# Patient Record
Sex: Male | Born: 1950
Health system: Southern US, Academic
[De-identification: ages and names within clinical notes are randomized; demographics above are authoritative.]

---

## 2006-04-20 ENCOUNTER — Ambulatory Visit (INDEPENDENT_AMBULATORY_CARE_PROVIDER_SITE_OTHER): Payer: Self-pay

## 2020-04-28 ENCOUNTER — Other Ambulatory Visit (INDEPENDENT_AMBULATORY_CARE_PROVIDER_SITE_OTHER): Payer: Self-pay | Admitting: Family

## 2020-04-28 NOTE — Progress Notes (Signed)
error 

## 2023-03-02 IMAGING — MR MRI LUMBAR SPINE WITHOUT CONTRAST
7 of 9 series · 15 of 48 positions shown · IV contrast (gadolinium)
Comparison: Lumbar spine x-rays January 22, 2023.

________________________________________________________________________________________________ 
MRI LUMBAR SPINE WITHOUT CONTRAST, 03/02/2023 [DATE]: 
CLINICAL INDICATION: Cervicalgia. Low back pain, unspecified. Radiculopathy, 
cervical region , low back pain. Right-sided.
TECHNIQUE: Multiplanar, multiecho position MR images of the lumbar spine were 
performed without intravenous gadolinium enhancement. Patient was scanned on a 
1.5T magnet

[Series 101: survey · axial · 10.0mm · 1.25mm/px · z∈[-33,+201]mm · 2 of 10 slices shown]
[im 1/10]
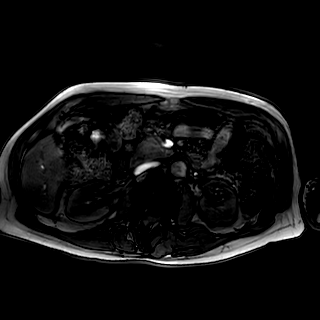
[im 10/10]
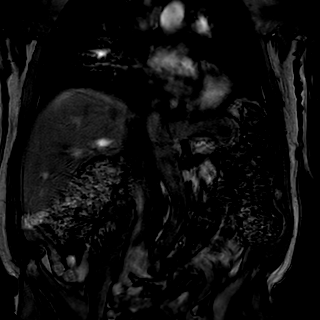

[Series 201: t2w_cor-surv · coronal · 6.0mm · 0.62mm/px · 1 of 10 slices shown]
[im 1/10]
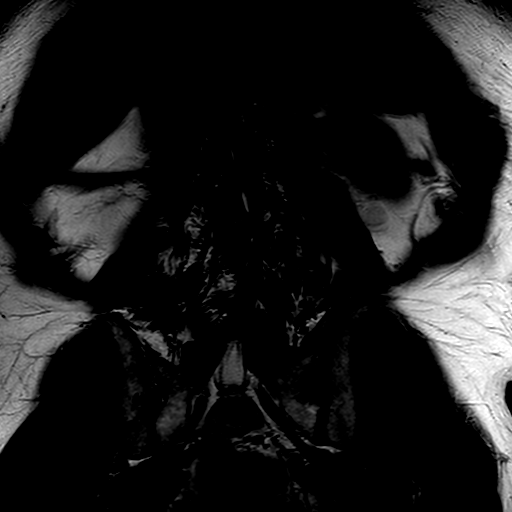

[Series 301: t1_tse_sag · sagittal · 4.0mm · 0.38mm/px · 2 of 22 slices shown]
[im 1/22]
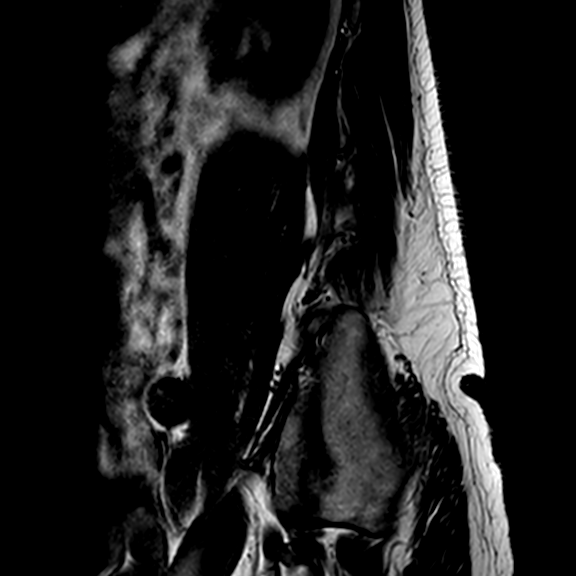
[im 22/22]
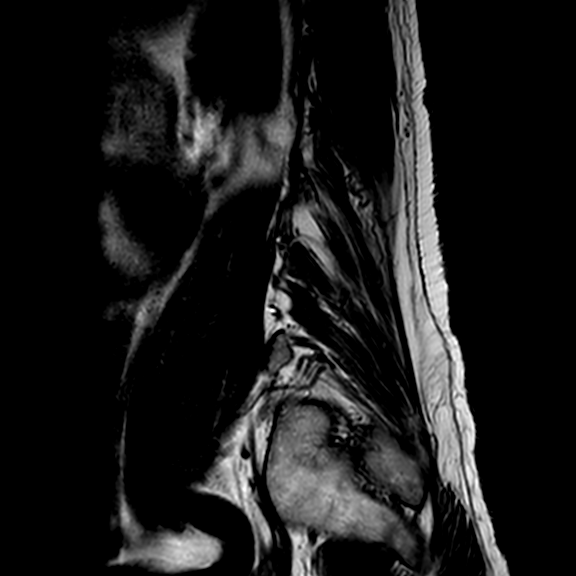

[Series 402: (id)_mdixon_tse · sagittal · 4.0mm · 0.51mm/px · 2 of 22 slices shown]
[im 1/22]
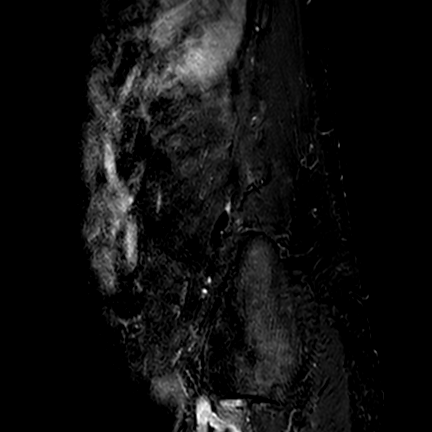
[im 22/22]
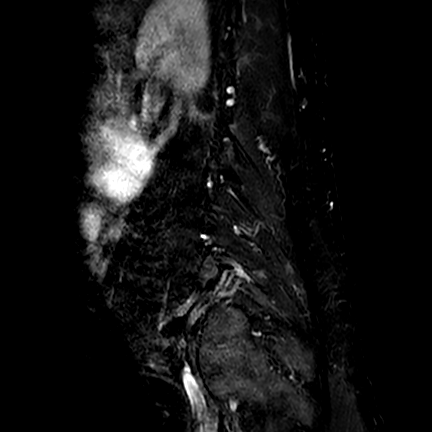

[Series 403: st2w_mdixon_tse · sagittal · 4.0mm · 0.51mm/px · 2 of 22 slices shown]
[im 1/22]
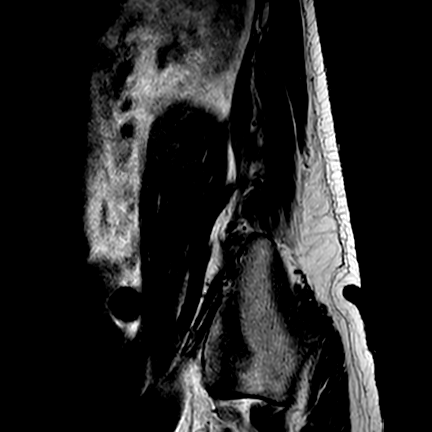
[im 22/22]
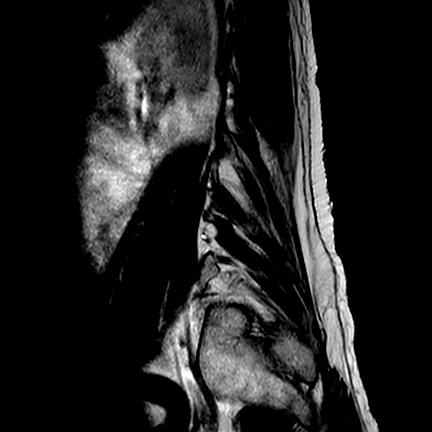

[Series 502: (id) view_ax mpr · axial · 1.0mm · 0.25mm/px · 1 of 145 slices shown]
[im 10/145]
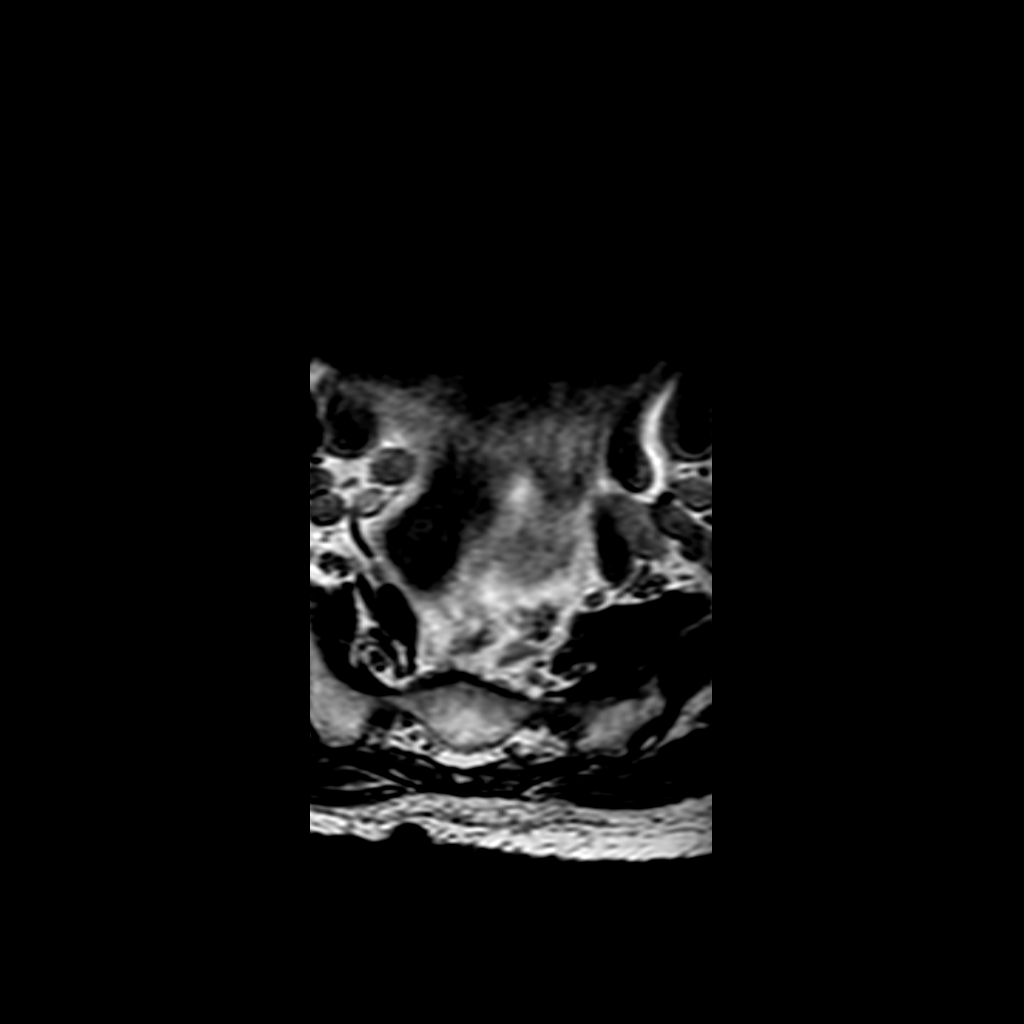

[Series 701: T1 · axial · 4.0mm · 0.39mm/px · z∈[-176,+6]mm · 5 of 42 slices shown]
[im 1/42]
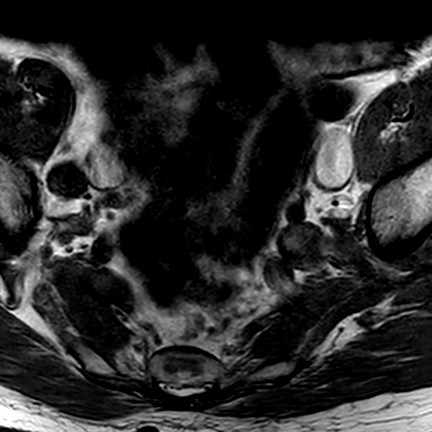
[im 11/42]
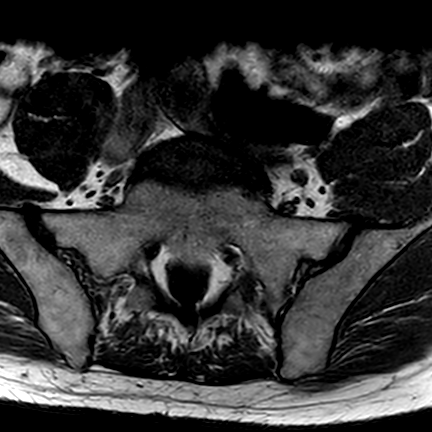
[im 21/42]
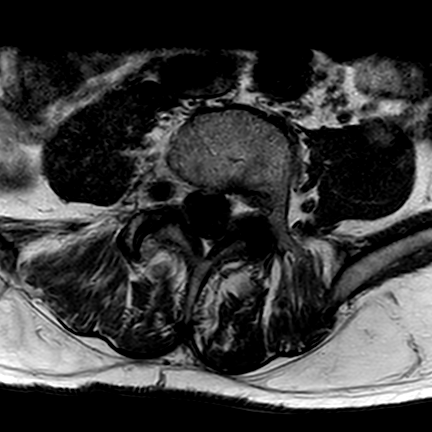
[im 31/42]
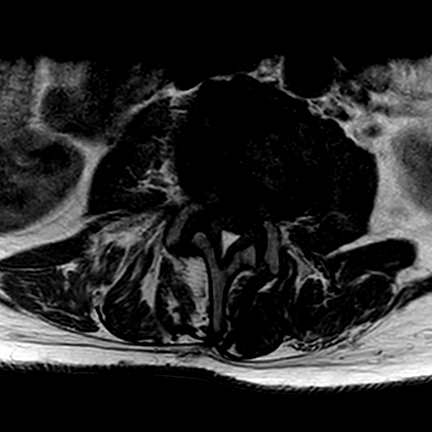
[im 42/42]
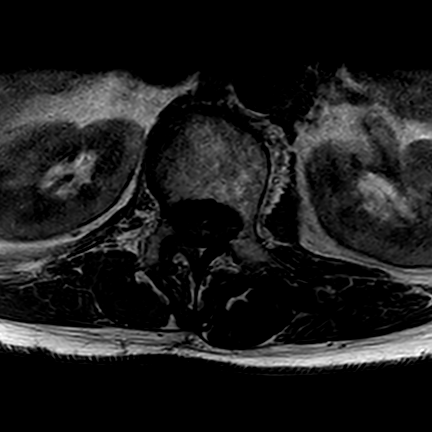

[15 of 48 positions shown; findings below may reference images not displayed]

FINDINGS: -------------------------------------------------------------------------------- 
------ 
GENERAL: 
Nomenclature is based on 5 lumbar type vertebral bodies.     
ALIGNMENT: Moderate levoconvex lumbar scoliosis. Stepwise grade 1 retrolisthesis 
T12 on L1, L1 on L2 and L2 on L3. Minimal grade 1 anterolisthesis L5 on S1. 
VERTEBRAL BODY HEIGHT: Normal.  
MARROW SIGNAL: No focal suspect signal abnormality. 
CORD SIGNAL: Normal distal spinal cord and cauda equina. Conus medullaris 
terminates at L1. 
ADDITIONAL FINDINGS: Right lower pole renal cyst. Ectasia left common iliac 
artery 21 mm. Right common iliac artery ectasia measures 24 mm. 
Modic I-II: L2-L3, L3-L4, L4-L5 
Ligamentum Flavum > 2.5 mm: All levels. 
-------------------------------------------------------------------------------- 
------ 
SEGMENTAL: 
T12-L1: Loss of disc signal. Minimal annular bulge. Minimal annular bulge. Small 
left foraminal disc protrusion. Canal and foramina are patent with normal 
facets. 
L1-L2: Moderate loss of disc height anteriorly. Loss of disc signal. 
Retrolisthesis with disc unroofing. Minimal annular bulge. Slight narrowing 
right lateral recess. Canal otherwise patent. Small bilateral facet joint 
effusions. Mild right foraminal narrowing. Left foramen patent. 
L2-L3: Severe loss of disc height specifically to the right. Endplate 
irregularity is degenerative. Loss of disc signal. Retrolisthesis with disc 
unroofing. Mild canal stenosis with slight narrowing right lateral recess. Left 
facet joint effusion. Mild right foraminal narrowing. Left foramen patent. 
L3-L4: Mild to moderate loss of disc height. Slight loss of disc signal with 
minimal annular bulge. Slight narrowing right lateral recess. Canal otherwise 
patent. Mild bilateral foraminal narrowing. The exiting left L3 nerve root is 
slightly deviated by left foraminal disc protrusion. 
L4-L5: Moderate loss of disc height. Schmorls node. Loss of disc signal. 
Posterior osteophytic ridging with mild diffuse annular bulge. Mild canal 
stenosis with slight lateral recess narrowing bilaterally. Facet arthropathy and 
small right facet joint effusion. Mild bilateral foraminal narrowing. 
L5-S1: Loss of disc signal. Mild annular bulge. Disc abuts the traversing S1 
nerve roots but without definite nerve root distortion. Canal patent. Small 
bilateral facet joint effusions. Mild left foraminal narrowing. Right foramen 
patent. 
-------------------------------------------------------------------------------- 
------
IMPRESSION: Degenerative and scoliotic changes. 
No significant or critical central canal narrowing. Variable lateral recess and 
foraminal narrowing detailed above. 
Ectasia of both common iliac arteries.

## 2023-03-02 IMAGING — MR MRI CERVICAL SPINE WITHOUT CONTRAST
5 of 7 series · 22 of 48 positions shown · IV contrast (gadolinium)
Comparison: None.

________________________________________________________________________________________________ 
MRI CERVICAL SPINE WITHOUT CONTRAST, 03/02/2023 [DATE]: 
CLINICAL INDICATION: Neck pain with radiation to right shoulder.
TECHNIQUE: Multiplanar, multiecho position MR images of the cervical spine were 
performed without intravenous gadolinium enhancement. Patient was scanned on a 
1.5T magnet.

[Series 101: survey · axial · 10.0mm · 1.25mm/px · z∈[-34,+149]mm · 4 of 10 slices shown]
[im 1/10]
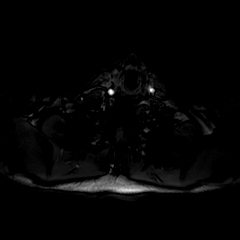
[im 4/10]
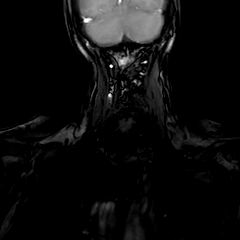
[im 7/10]
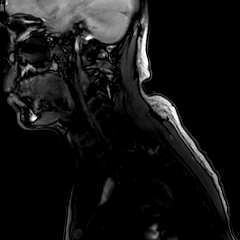
[im 10/10]
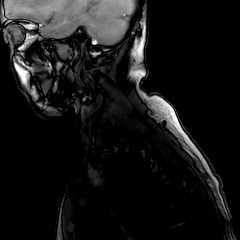

[Series 201: t2w_cor-surv · coronal · 5.0mm · 0.69mm/px · 2 of 7 slices shown]
[im 1/7]
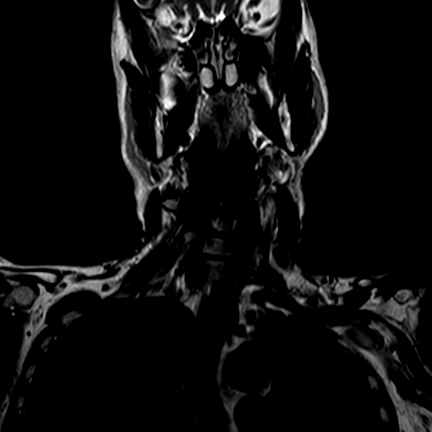
[im 7/7]
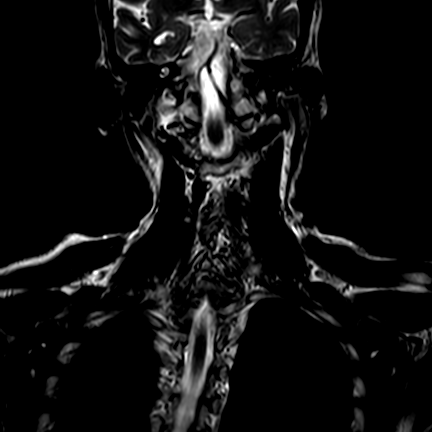

[Series 301: t1_sag · sagittal · 3.0mm · 0.35mm/px · 6 of 17 slices shown]
[im 1/17]
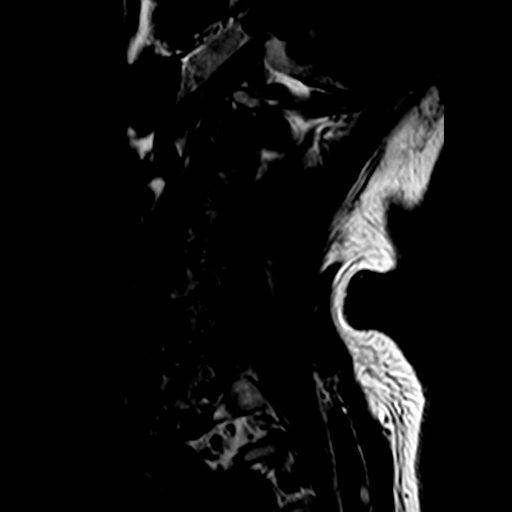
[im 4/17]
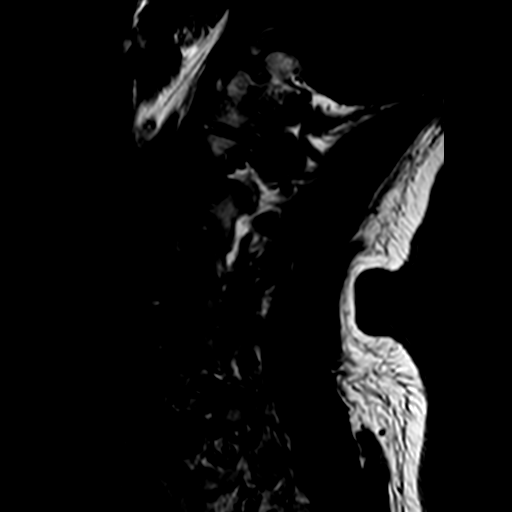
[im 7/17]
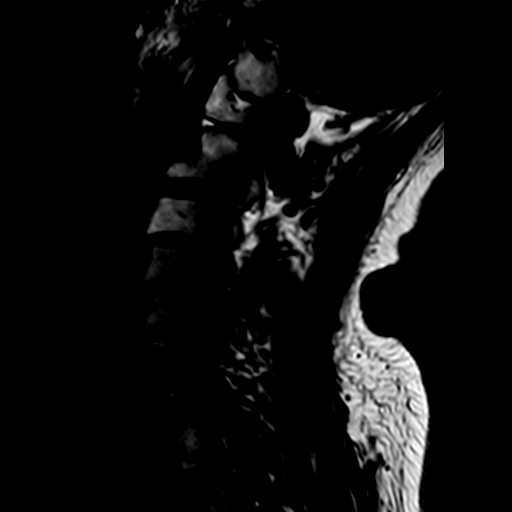
[im 10/17]
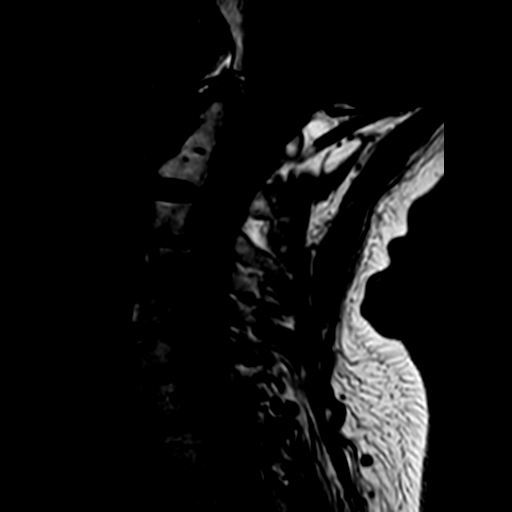
[im 13/17]
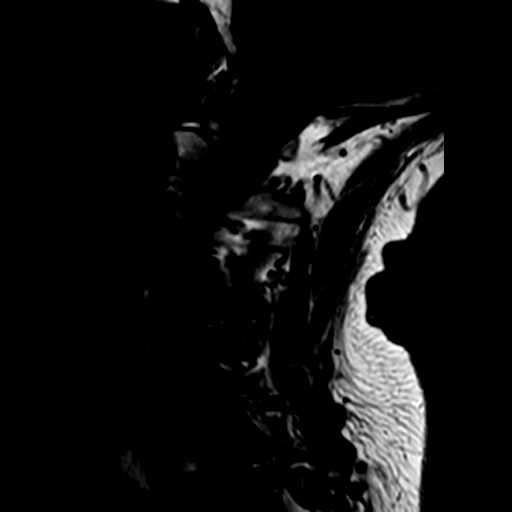
[im 17/17]
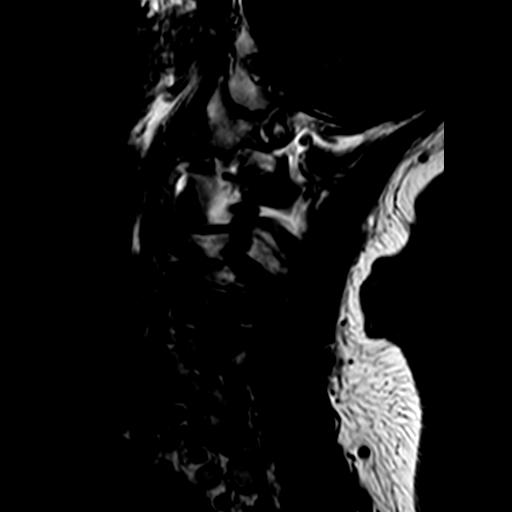

[Series 402: (id)_mdixon_tse · sagittal · 3.0mm · 0.42mm/px · 1 of 17 slices shown]
[im 1/17]
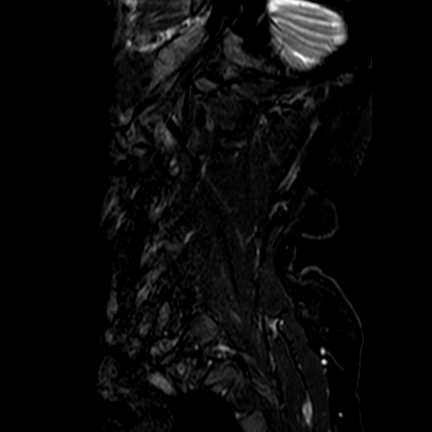

[Series 601: T2 · axial · 3.0mm · 0.31mm/px · z∈[-56,+38]mm · 9 of 34 slices shown]
[im 1/34]
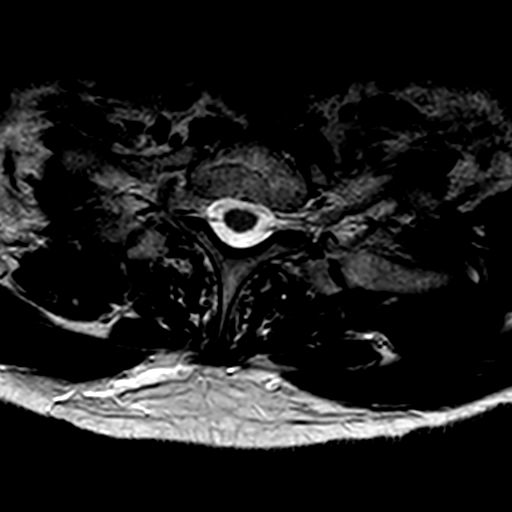
[im 7/34]
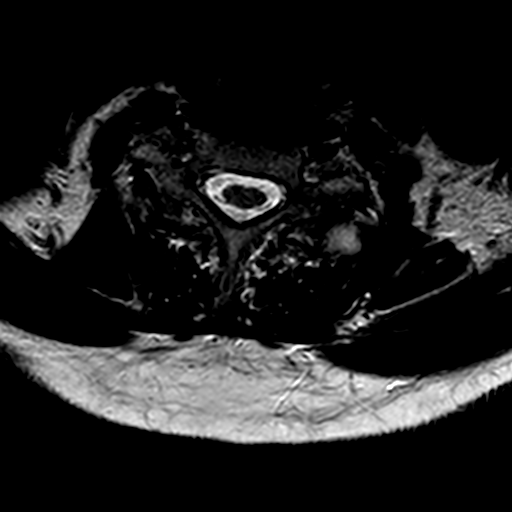
[im 10/34]
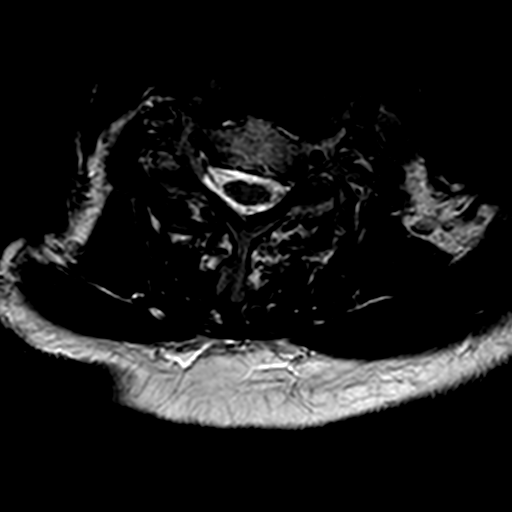
[im 16/34]
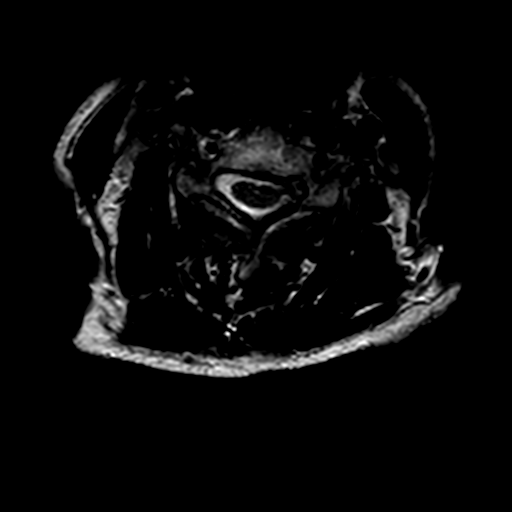
[im 19/34]
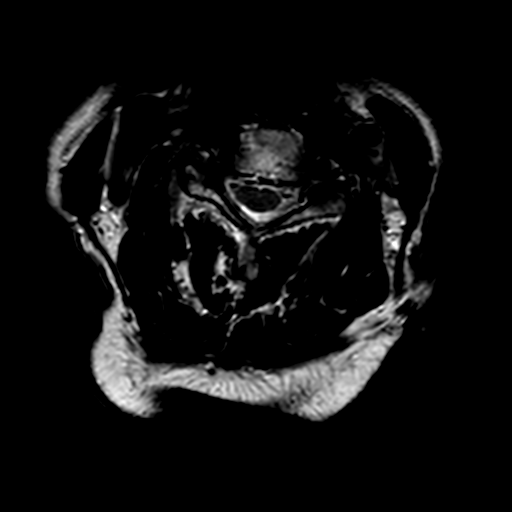
[im 25/34]
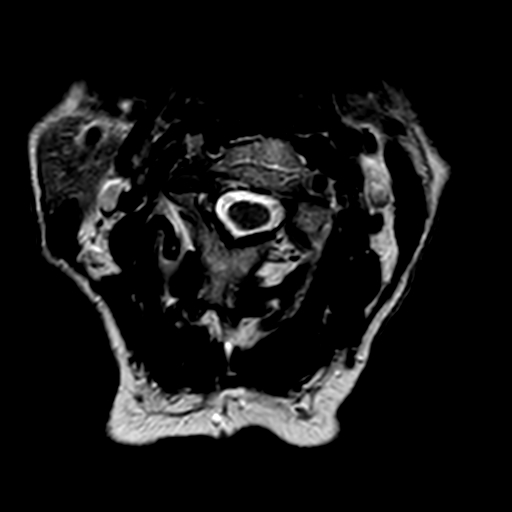
[im 28/34]
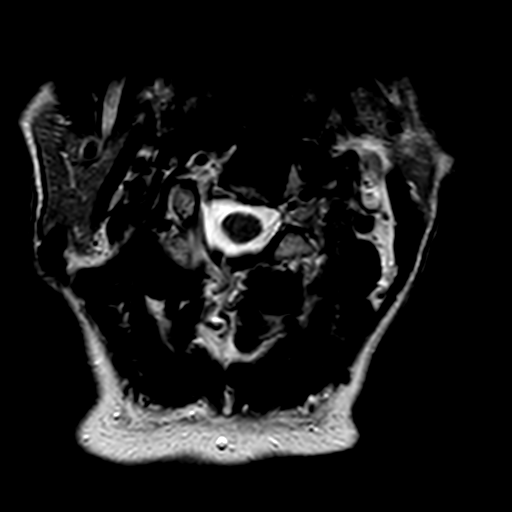
[im 31/34]
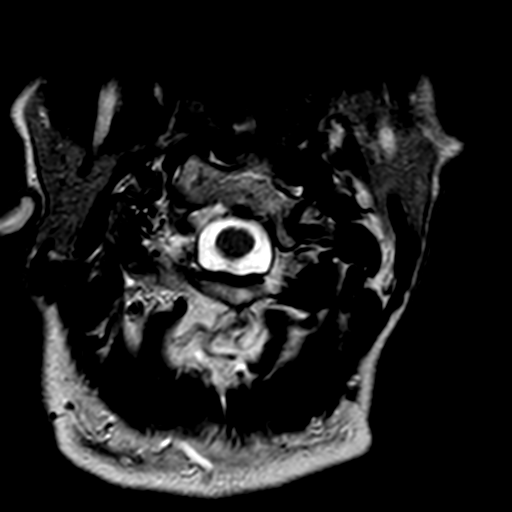
[im 34/34]
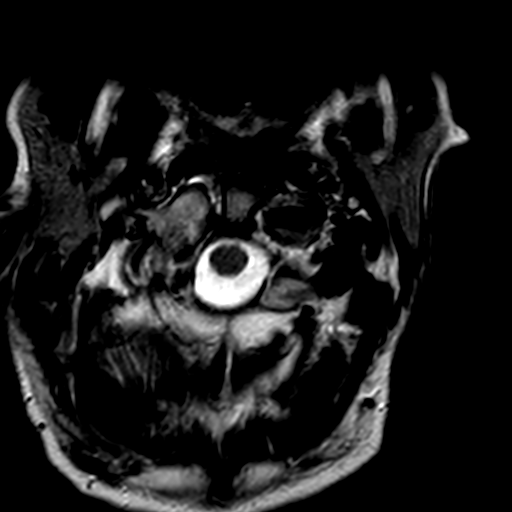

[22 of 48 positions shown; findings below may reference images not displayed]

FINDINGS: -------------------------------------------------------------------------------- 
----------------- 
GENERAL: 
ALIGNMENT: Leftward curvature of the cervical spine. Grade 1 anterolisthesis of 
C3 on C4. Minimal anterolisthesis of C4 on C5. Minimal retrolisthesis of C5 on 
C6. Grade 1 anterolisthesis of C7 on T1. 
VERTEBRAL BODY HEIGHT: Normal.  
MARROW SIGNAL: Active endplate change at C4-C5. T2 prolongation along the right 
C4-C5 facet joint which may result in local pain. 
CORD SIGNAL: Normal.  
ADDITIONAL FINDINGS: None. 
-------------------------------------------------------------------------------- 
---------------- 
SEGMENTAL: 
CRANIOCERVICAL JUNCTION: No significant stenosis. 
C2-C3: No significant central canal or neural foraminal narrowing. 
C3-C4: Uncovering of the disc space with disc osteophyte complex eccentric to 
the right side. Right uncovertebral joint hypertrophy. Right facet hypertrophy. 
Mild right-sided central canal narrowing noting deformity of the right ventral 
cord. Severe right and no significant left neural foraminal narrowing. 
C4-C5: Disc osteophyte complex with bilateral uncovertebral joint hypertrophy. 
Right facet hypertrophy. Mild central canal narrowing. Severe left neural 
foraminal narrowing. Severe right neural foraminal narrowing. 
C5-C6: Disc osteophyte complex with left greater than right uncovertebral joint 
hypertrophy. Mild deformity of ventral cord with mild central canal narrowing. 
Severe left neural foraminal narrowing. No significant right neural foraminal 
narrowing. 
C6-C7: Disc osteophyte complex with left uncovertebral joint hypertrophy. Left 
facet hypertrophy. Mild left-sided central canal narrowing. Moderate left neural 
foraminal narrowing. No significant right neural foraminal narrowing. 
C7-T1: Uncovering of the disc space with bilateral facet hypertrophy. No 
significant central canal narrowing. No significant right neural foraminal 
narrowing. Very mild left neural foraminal narrowing. 
-------------------------------------------------------------------------------- 
---------------
IMPRESSION: 1.  Discogenic/degenerative changes as above. 
2.  Edema along the right C4-C5 facet joint which may result in local pain. 
3.  Cord signal abnormality: None. 
4.  Cord deformity: C3-C4, C5-C6 
5.  Severe neural foraminal narrowing: C3-C4 (right), C4-C5 (bilateral), C5-C6 
(left).
